# Patient Record
Sex: Female | Born: 1995 | Race: White | Hispanic: No | Marital: Single | Smoking: Never smoker
Health system: Southern US, Community
[De-identification: ages and names within clinical notes are randomized; demographics above are authoritative.]

## PROBLEM LIST (undated history)

## (undated) DIAGNOSIS — K5909 Other constipation: Secondary | ICD-10-CM

## (undated) DIAGNOSIS — F419 Anxiety disorder, unspecified: Secondary | ICD-10-CM

## (undated) HISTORY — PX: WISDOM TOOTH EXTRACTION: SHX21

---
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
## 2015-07-13 ENCOUNTER — Ambulatory Visit
Admission: RE | Admit: 2015-07-13 | Discharge: 2015-07-13 | Disposition: A | Discharge status: Home / Self Care | Payer: 59 | Source: Ambulatory Visit | Attending: Pediatrics | Admitting: Pediatrics

## 2015-07-13 ENCOUNTER — Other Ambulatory Visit: Payer: Self-pay | Admitting: Pediatrics

## 2015-07-13 ENCOUNTER — Other Ambulatory Visit: Payer: Self-pay

## 2015-07-13 DIAGNOSIS — R0602 Shortness of breath: Secondary | ICD-10-CM | POA: Diagnosis present

## 2015-12-20 ENCOUNTER — Encounter: Payer: Self-pay | Admitting: *Deleted

## 2015-12-20 ENCOUNTER — Ambulatory Visit
Admission: EM | Admit: 2015-12-20 | Discharge: 2015-12-20 | Disposition: A | Discharge status: Home / Self Care | Payer: 59 | Attending: Family Medicine | Admitting: Family Medicine

## 2015-12-20 DIAGNOSIS — J029 Acute pharyngitis, unspecified: Secondary | ICD-10-CM | POA: Diagnosis present

## 2015-12-20 DIAGNOSIS — M791 Myalgia: Secondary | ICD-10-CM | POA: Diagnosis not present

## 2015-12-20 DIAGNOSIS — R079 Chest pain, unspecified: Secondary | ICD-10-CM

## 2015-12-20 DIAGNOSIS — R52 Pain, unspecified: Secondary | ICD-10-CM | POA: Diagnosis not present

## 2015-12-20 LAB — RAPID INFLUENZA A&B ANTIGENS
Influenza A (ARMC): NEGATIVE
Influenza B (ARMC): NEGATIVE

## 2015-12-20 LAB — RAPID STREP SCREEN (MED CTR MEBANE ONLY): Streptococcus, Group A Screen (Direct): NEGATIVE

## 2015-12-20 NOTE — Discharge Instructions (Signed)
Go directly to emergency room as discussed.  °

## 2015-12-20 NOTE — ED Provider Notes (Signed)
[facility]  ____________________________________________  Time seen: Approximately 9:21 PM  I have reviewed the triage vital signs and the nursing notes.   HISTORY  Chief Complaint Chills; Generalized Body Aches; Fever; Sore Throat; and Weakness   HPI Kendra Murphy is a 20 y.o. female presents with mother at bedside for the complaints of 3 days of body aches, occasional chills, chest pain, chest pressure and some chest pain with deep breath. States symptoms present x three days and unchanged. Denies cough. Denies sore throat. Denies nausea, vomiting, diarrhea. Reports her father had the flu about a week and a half ago. Denies other known sick contacts. Reports recently changed oral birth control approximately one month ago.  Patient states that chest discomfort is at 6 out of 10 and described as a heaviness and feels like she just can't take a good deep breath as well as when she does take a deep breath that she has some sharper chest pains bilaterally beneath her breasts. Denies fall or trauma. Denies recent sickness, recent immobilization, recent sedentary changes. Denies recent surgery.  Patient reports that she has had some chest pain in the past that was unclear of the cause. Patient states that she had an EKG at her primary doctor's that was normal.  PCP: [facility].  LMP: 3 weeks ago. Denies chance of pregnancy.   History reviewed. No pertinent past medical history.  There are no active problems to display for this patient.   History reviewed. No pertinent past surgical history.  Current Outpatient Rx  Name  Route  Sig  Dispense  Refill  . sertraline (ZOLOFT) 25 MG tablet   Oral   Take 25 mg by mouth daily.           Allergies Review of patient's allergies indicates no known allergies.  History reviewed. No pertinent family history.  Social History Social History  Substance Use Topics  . Smoking status: Never Smoker   . Smokeless tobacco:  None  . Alcohol Use: No    Review of Systems Constitutional: Positive chills. Positive body aches. Eyes: No visual changes. ENT: No sore throat. Cardiovascular: As above Respiratory: As above Gastrointestinal: No abdominal pain.  No nausea, no vomiting.  No diarrhea.  No constipation. Genitourinary: Negative for dysuria. Musculoskeletal: Negative for back pain. Skin: Negative for rash. Neurological: Negative for headaches, focal weakness or numbness.  10-point ROS otherwise negative.  ____________________________________________   PHYSICAL EXAM:  VITAL SIGNS: ED Triage Vitals  Enc Vitals Group     BP 12/20/15 2024 112/62 mmHg     Pulse Rate 12/20/15 2024 106     Resp 12/20/15 2024 20     Temp 12/20/15 2024 99.2 F (37.3 C)     Temp Source 12/20/15 2024 Oral     SpO2 12/20/15 2024 100 %     Weight 12/20/15 2024 95 lb (43.092 kg)     Height 12/20/15 2024 5' (1.524 m)     Head Cir --      Peak Flow --      Pain Score --      Pain Loc --      Pain Edu? --      Excl. in [place]? --     Constitutional: Alert and oriented. Well appearing and in no acute distress. Eyes: Conjunctivae are normal. PERRL. EOMI. Head: Atraumatic. No sinus tenderness palpation. No erythema. No swelling.  Ears: no erythema, normal TMs bilaterally.   Nose: No congestion/rhinnorhea.  Mouth/Throat: Mucous membranes are  moist.  Oropharynx non-erythematous. No tonsillar swelling or exudate. Neck: No stridor.  No cervical spine tenderness to palpation. Hematological/Lymphatic/Immunilogical: No cervical lymphadenopathy. Cardiovascular: Normal rate, regular rhythm. Grossly normal heart sounds.  Good peripheral circulation. Respiratory: Normal respiratory effort.  No retractions. Lungs CTAB. No wheezes, rales or rhonchi. No cough noted in room. Chest nontender to palpation.Speaks in complete sentences.  Gastrointestinal: Mild tenderness to palpation bilateral upper quadrants, no epigastric tenderness, abdomen  otherwise soft and nontender. Normal Bowel sounds.  No abdominal bruits. No CVA tenderness. Musculoskeletal: No lower or upper extremity tenderness nor edema.  No calf tenderness bilaterally. Bilateral pedal pulses equal and easily palpated. Ambulatory in room a steady gait. Neurologic:  Normal speech and language. No gross focal neurologic deficits are appreciated. No gait instability. Skin:  Skin is warm, dry and intact. No rash noted. Psychiatric: Mood and affect are normal. Speech and behavior are normal.  ____________________________________________   LABS (all labs ordered are listed, but only abnormal results are displayed)  Labs Reviewed  RAPID INFLUENZA A&B ANTIGENS ([facility] ONLY)  RAPID STREP SCREEN (NOT AT [facility])  CULTURE, GROUP A STREP [facility])   ____________________________________________  EKG  ED ECG REPORT I, Renford Dills, the attending physician and Dr Judd Gaudier, personally viewed and interpreted this ECG.   Date: 12/20/2015  EKG Time: 2125  Rate: 93  Rhythm: normal EKG, normal sinus rhythm, unchanged from previous tracings  Axis: normal  Intervals:none  ST&T Change: none   INITIAL IMPRESSION / ASSESSMENT AND PLAN / ED COURSE  Pertinent labs & imaging results that were available during my care of the patient were reviewed by me and considered in my medical decision making (see chart for details).  Overall well-appearing patient. No acute distress. Mother at bedside. Presents for the complaints of 3 days of body aches, chest pain and shortness of breath. Patient also reports some chest pain with deep breath. Denies cough congestion or sore throat. Reports has been eating and drinking well. Patient also with mild abdominal pain over last day that she did not initially mention. Patient's symptoms concerning for viral illness with low grade fever however patient's report of chest discomfort with recent oral contraceptive changing and mild tachycardia concern for pulmonary  emboli. Discussed with patient also possibility of anxiety trigger. Patient reports that she does have some history of anxiety but states that she does not feel like she's anxious at this time. Discussed these concerns in detail with patient and mother. And recommend for patient be further evaluated at emergency room of choice. Patient and mother state that they will go to [facility]. Mother and patient states that mother will drive the patient. Patient alert and oriented with decisional capacity and states that she does not want to go by ambulance and states that she understands risk of even up to death by private vehicle transfer. Patient stable at the time of transfer. Rosanne Ashing Forensic scientist at [facility] called by myself and report given.  Discussed follow up with Primary care physician this week. Discussed follow up and return parameters including no resolution or any worsening concerns. Patient verbalized understanding and agreed to plan.   ____________________________________________   FINAL CLINICAL IMPRESSION(S) / ED DIAGNOSES  Final diagnoses:  Body aches  Chest pain, unspecified chest pain type      Note: This dictation was prepared with Dragon dictation along with smaller phrase technology. Any transcriptional errors that result from this process are unintentional.    Renford Dills, NP 12/20/15 2221

## 2015-12-20 NOTE — ED Notes (Signed)
[facility] at patient request. Stevie KernL. Miller NP called report to them. Patient d/c'd ambulatory with Mom to drive

## 2015-12-20 NOTE — ED Notes (Signed)
Chills, body aches, fever, sore throat, weakness starting Saturday. Father recently dx with flu.

## 2015-12-23 LAB — CULTURE, GROUP A STREP (THRC)

## 2017-11-13 ENCOUNTER — Other Ambulatory Visit (HOSPITAL_COMMUNITY): Payer: Self-pay | Admitting: Pediatrics

## 2017-11-13 ENCOUNTER — Other Ambulatory Visit: Payer: Self-pay | Admitting: Pediatrics

## 2017-11-13 DIAGNOSIS — N938 Other specified abnormal uterine and vaginal bleeding: Secondary | ICD-10-CM

## 2017-11-16 ENCOUNTER — Ambulatory Visit
Admission: RE | Admit: 2017-11-16 | Discharge: 2017-11-16 | Disposition: A | Discharge status: Home / Self Care | Payer: BLUE CROSS/BLUE SHIELD | Source: Ambulatory Visit | Attending: Pediatrics | Admitting: Pediatrics

## 2017-11-16 DIAGNOSIS — N938 Other specified abnormal uterine and vaginal bleeding: Secondary | ICD-10-CM | POA: Insufficient documentation

## 2019-08-19 ENCOUNTER — Other Ambulatory Visit: Payer: Self-pay

## 2019-08-19 ENCOUNTER — Ambulatory Visit (INDEPENDENT_AMBULATORY_CARE_PROVIDER_SITE_OTHER): Payer: 59

## 2019-08-19 ENCOUNTER — Encounter: Payer: Self-pay | Admitting: Emergency Medicine

## 2019-08-19 ENCOUNTER — Ambulatory Visit
Admission: EM | Admit: 2019-08-19 | Discharge: 2019-08-19 | Disposition: A | Discharge status: Home / Self Care | Payer: 59 | Attending: Family Medicine | Admitting: Family Medicine

## 2019-08-19 DIAGNOSIS — S92424A Nondisplaced fracture of distal phalanx of right great toe, initial encounter for closed fracture: Secondary | ICD-10-CM | POA: Diagnosis not present

## 2019-08-19 DIAGNOSIS — W208XXA Other cause of strike by thrown, projected or falling object, initial encounter: Secondary | ICD-10-CM | POA: Diagnosis not present

## 2019-08-19 DIAGNOSIS — M79674 Pain in right toe(s): Secondary | ICD-10-CM

## 2019-08-19 MED ORDER — MELOXICAM 15 MG PO TABS: 15.0000 mg | ORAL_TABLET | Freq: Every day | ORAL | 0 refills | Status: DC | PRN

## 2019-08-19 NOTE — Discharge Instructions (Signed)
Rest.  Ice.  Medication as prescribed.  Post op shoe for comfort.  Take care  Dr. Lacinda Axon

## 2019-08-19 NOTE — ED Provider Notes (Signed)
[facility]    CSN: [id number] Arrival date & time: 08/19/19  1059  History   Chief Complaint Chief Complaint  Patient presents with   Toe Pain    right great toe   HPI  23 year old female presents with a toe injury.  Patient was at work.  She states that she was carrying a box and fell.  In doing so she injured her right great toe.  This occurred this morning.  She rates her pain as 3/10 in severity.  She reports swelling.  She localizes the pain to the IP joint of the right great toe.  No medications or interventions tried.  Exacerbated by touch.  No relieving factors.  No other complaints.  PMH, Surgical Hx, Family Hx, Social History reviewed and updated as below.  PMH: Macromastia, DUB  Past Surgical History:  Procedure Laterality Date   WISDOM TOOTH EXTRACTION     OB History   No obstetric history on file.    Home Medications    Prior to Admission medications   Medication Sig Start Date End Date Taking? Authorizing Provider  sertraline (ZOLOFT) 25 MG tablet Take 25 mg by mouth daily.   Yes [provider]  meloxicam (MOBIC) 15 MG tablet Take 1 tablet (15 mg total) by mouth daily as needed for pain. 08/19/19   Coral Spikes, DO   Family History Family History  Problem Relation Age of Onset   Healthy Mother    Healthy Father    Social History Social History   Tobacco Use   Smoking status: Never Smoker   Smokeless tobacco: Never Used  Substance Use Topics   Alcohol use: No   Drug use: Not Currently    Allergies   Patient has no known allergies.   Review of Systems Review of Systems  Constitutional: Negative.   Musculoskeletal:       Right great toe pain.   Physical Exam Triage Vital Signs ED Triage Vitals  Enc Vitals Group     BP 08/19/19 1132 109/79     Pulse Rate 08/19/19 1132 77     Resp 08/19/19 1132 18     Temp 08/19/19 1132 98.3 F (36.8 C)     Temp Source 08/19/19 1132 Oral     SpO2 08/19/19 1132 99 %   Weight 08/19/19 1130 106 lb (48.1 kg)     Height 08/19/19 1130 5' (1.524 m)     Head Circumference --      Peak Flow --      Pain Score 08/19/19 1130 3     Pain Loc --      Pain Edu? --      Excl. in [place]? --    No data found.  Updated Vital Signs BP 109/79 (BP Location: Left Arm)    Pulse 77    Temp 98.3 F (36.8 C) (Oral)    Resp 18    Ht 5' (1.524 m)    Wt 48.1 kg    SpO2 99%    BMI 20.70 kg/m   Visual Acuity Right Eye Distance:   Left Eye Distance:   Bilateral Distance:    Right Eye Near:   Left Eye Near:    Bilateral Near:     Physical Exam Vitals signs and nursing note reviewed.  Constitutional:      General: She is not in acute distress.    Appearance: Normal appearance. She is not ill-appearing.  HENT:     Head: Normocephalic and  atraumatic.  Eyes:     General:        Right eye: No discharge.        Left eye: No discharge.     Conjunctiva/sclera: Conjunctivae normal.  Pulmonary:     Effort: Pulmonary effort is normal. No respiratory distress.  Feet:     Comments: Right great toe with swelling noted distally.  Exquisite tenderness to palpation at the IP joint. Skin:    General: Skin is warm.     Findings: No rash.  Neurological:     General: No focal deficit present.     Mental Status: She is alert and oriented to person, place, and time.  Psychiatric:        Mood and Affect: Mood normal.        Behavior: Behavior normal.    UC Treatments / Results  Labs (all labs ordered are listed, but only abnormal results are displayed) Labs Reviewed - No data to display  EKG   Radiology Dg Toe Great Right  Result Date: 08/19/2019 CLINICAL DATA:  Pain following fall EXAM: RIGHT FIRST TOE: 3 V COMPARISON:  None. FINDINGS: Frontal, oblique, lateral views were obtained. There is a fracture along the dorsal aspect of the proximal portion of the first distal phalanx with fragment extending into the first IP joint. Alignment at the fracture site is near anatomic. No  other fractures are evident. No dislocation. Joint spaces appear normal. No erosive change. IMPRESSION: Fracture along the dorsal aspect of the proximal portion of the first distal phalanx with alignment at the fracture site near anatomic. No other fracture. No dislocation. No appreciable arthropathy. These results will be called to the ordering clinician or representative by the Radiologist Assistant, and communication documented in the PACS or zVision Dashboard. Electronically Signed   By: Bretta Bang III M.D.   On: 08/19/2019 11:46    Procedures Procedures (including critical care time)  Medications Ordered in UC Medications - No data to display  Initial Impression / Assessment and Plan / UC Course  I have reviewed the triage vital signs and the nursing notes.  Pertinent labs & imaging results that were available during my care of the patient were reviewed by me and considered in my medical decision making (see chart for details).    23 year old female presents with a fracture of the dorsal aspect of the proximal portion of the first distal phalanx of the right great toe.  Meloxicam as directed. Placed in postop shoe.  Okay to return to work with restrictions.  Final Clinical Impressions(s) / UC Diagnoses   Final diagnoses:  Closed nondisplaced fracture of distal phalanx of right great toe, initial encounter     Discharge Instructions     Rest.  Ice.  Medication as prescribed.  Post op shoe for comfort.  Take care  Dr. Adriana Simas    ED Prescriptions    Medication Sig Dispense Auth. Provider   meloxicam (MOBIC) 15 MG tablet Take 1 tablet (15 mg total) by mouth daily as needed for pain. 30 tablet Tommie Sams, DO     PDMP not reviewed this encounter.   Tommie Sams, DO 08/19/19 1308

## 2019-08-19 NOTE — ED Triage Notes (Signed)
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 This occurred this morning.

## 2019-09-15 ENCOUNTER — Other Ambulatory Visit: Payer: Self-pay | Admitting: Family Medicine

## 2020-07-13 IMAGING — CR DG TOE GREAT 2+V*R*
3 series · 4 of 4 positions shown · non-contrast
Comparison: None.

CLINICAL DATA: Pain following fall

EXAM:
RIGHT FIRST TOE: 3 V

[toe ap]
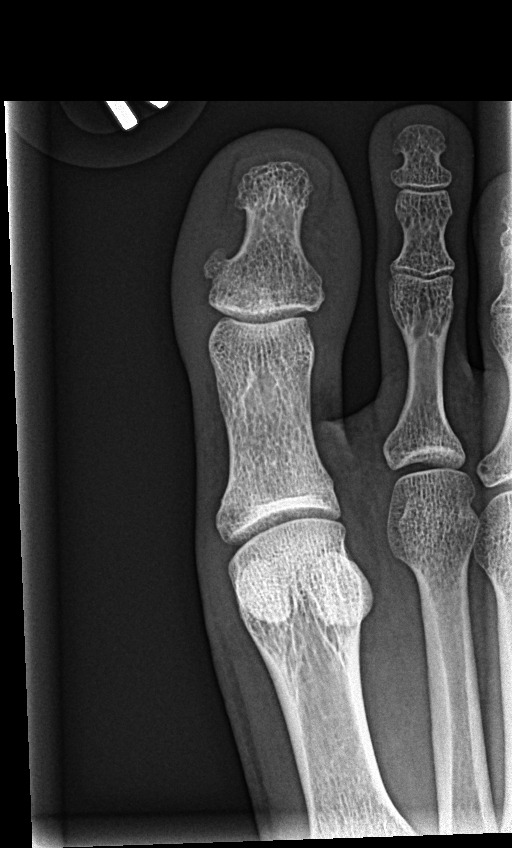

[Series 2: toe obl · 0.14mm/px · 2 of 2 slices shown]
[im 1/2]
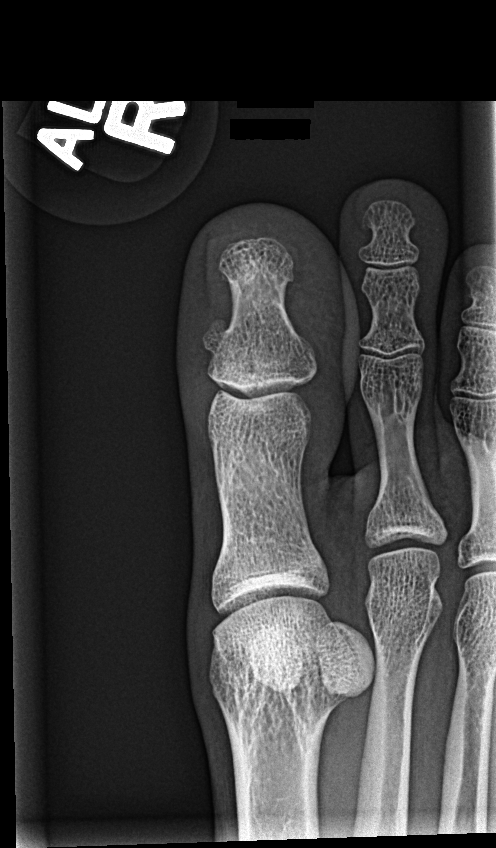
[im 2/2]
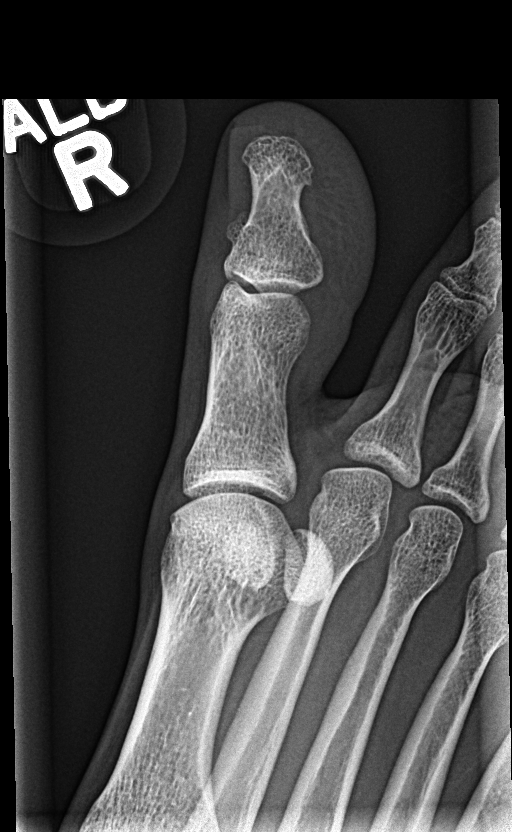

[toe lat]
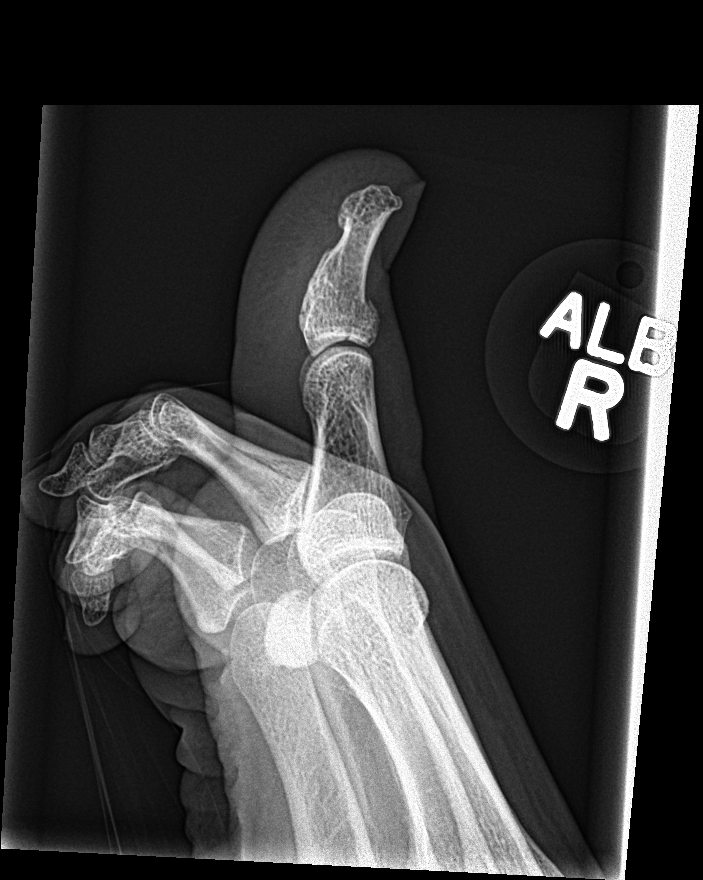

[4 of 4 positions shown; findings below may reference images not displayed]

FINDINGS: Frontal, oblique, lateral views were obtained. There is a fracture
along the dorsal aspect of the proximal portion of the first distal
phalanx with fragment extending into the first IP joint. Alignment
at the fracture site is near anatomic. No other fractures are
evident. No dislocation. Joint spaces appear normal. No erosive
change.
IMPRESSION: Fracture along the dorsal aspect of the proximal portion of the
first distal phalanx with alignment at the fracture site near
anatomic. No other fracture. No dislocation. No appreciable
arthropathy.

These results will be called to the ordering clinician or
representative by the Radiologist Assistant, and communication
documented in the PACS or zVision Dashboard.

## 2020-08-27 ENCOUNTER — Ambulatory Visit: Payer: Self-pay

## 2020-08-27 ENCOUNTER — Other Ambulatory Visit: Payer: Self-pay

## 2020-08-27 ENCOUNTER — Ambulatory Visit
Admission: EM | Admit: 2020-08-27 | Discharge: 2020-08-27 | Disposition: A | Discharge status: Home / Self Care | Payer: BC Managed Care – PPO | Attending: Emergency Medicine | Admitting: Emergency Medicine

## 2020-08-27 ENCOUNTER — Encounter: Payer: Self-pay | Admitting: Emergency Medicine

## 2020-08-27 DIAGNOSIS — N898 Other specified noninflammatory disorders of vagina: Secondary | ICD-10-CM | POA: Insufficient documentation

## 2020-08-27 LAB — URINALYSIS, COMPLETE (UACMP) WITH MICROSCOPIC
Bilirubin Urine: NEGATIVE
Glucose, UA: NEGATIVE mg/dL
Ketones, ur: NEGATIVE mg/dL
Nitrite: NEGATIVE
Protein, ur: NEGATIVE mg/dL
Specific Gravity, Urine: 1.01 (ref 1.005–1.030)
pH: 5.5 (ref 5.0–8.0)

## 2020-08-27 LAB — WET PREP, GENITAL
Clue Cells Wet Prep HPF POC: NONE SEEN
Sperm: NONE SEEN
Trich, Wet Prep: NONE SEEN
Yeast Wet Prep HPF POC: NONE SEEN

## 2020-08-27 LAB — CHLAMYDIA/NGC RT PCR (ARMC ONLY)
Chlamydia Tr: NOT DETECTED
N gonorrhoeae: NOT DETECTED

## 2020-08-27 NOTE — ED Provider Notes (Signed)
[facility]    CSN: [id number] Arrival date & time: 08/27/20  6270      History   Chief Complaint Chief Complaint  Patient presents with  . Vaginal Discharge    HPI Kendra Murphy is a 24 y.o. female.   Unk Lightning presents with complaints of vaginal itching which started around 5 days ago. She had vaginal discharge which was concerning for yeast infection at that time so she did a virtual visit and was given medications for this- took 150mg  diflucan on 11/9 and 11/11. She feels the itching hasn't improved. No further noticeable discharge. Itching is within the vagina, denies any external vulvar itching or complaints. She did wax pubic hair yesterday with some mild irritation from this but no itching. Denies any sores or lesions. Denies any known new products- soaps, detergents, lubricants, condoms etc. Has an IUD and had spotting/period around 3 weeks ago. Sexually active with 1 partner and doesn't regularly use condoms. Denies any concerns for std's. States she has some mild burning with urination but unable to differentiate if this is external/vulvar from urine or if it is urethral. No urinary urgency or freuqency.   ROS per HPI, negative if not otherwise mentioned.      History reviewed. No pertinent past medical history.  There are no problems to display for this patient.   Past Surgical History:  Procedure Laterality Date  . WISDOM TOOTH EXTRACTION      OB History   No obstetric history on file.      Home Medications    Prior to Admission medications   Medication Sig Start Date End Date Taking? Authorizing Provider  levonorgestrel (LILETTA) 19.5 MCG/DAY IUD IUD by Intrauterine route. 08/11/19 08/10/25 Yes [provider]  meloxicam (MOBIC) 15 MG tablet Take 1 tablet (15 mg total) by mouth daily as needed for pain. 08/19/19   13/3/20, DO  sertraline (ZOLOFT) 25 MG tablet Take 25 mg by mouth daily.    [provider]     Family History Family History  Problem Relation Age of Onset  . Healthy Mother   . Healthy Father     Social History Social History   Tobacco Use  . Smoking status: Never Smoker  . Smokeless tobacco: Never Used  Vaping Use  . Vaping Use: Never used  Substance Use Topics  . Alcohol use: No  . Drug use: Not Currently     Allergies   Patient has no known allergies.   Review of Systems Review of Systems   Physical Exam Triage Vital Signs ED Triage Vitals  Enc Vitals Group     BP 08/27/20 0934 104/75     Pulse Rate 08/27/20 0934 (!) 103     Resp 08/27/20 0934 14     Temp 08/27/20 0934 98.2 F (36.8 C)     Temp Source 08/27/20 0934 Oral     SpO2 08/27/20 0934 99 %     Weight 08/27/20 0931 110 lb (49.9 kg)     Height 08/27/20 0931 5\' 1"  (1.549 m)     Head Circumference --      Peak Flow --      Pain Score 08/27/20 0931 5     Pain Loc --      Pain Edu? --      Excl. in [place]? --    No data found.  Updated Vital Signs BP 104/75 (BP Location: Left Arm)   Pulse (!) 103  Temp 98.2 F (36.8 C) (Oral)   Resp 14   Ht 5\' 1"  (1.549 m)   Wt 110 lb (49.9 kg)   SpO2 99%   BMI 20.78 kg/m   Visual Acuity Right Eye Distance:   Left Eye Distance:   Bilateral Distance:    Right Eye Near:   Left Eye Near:    Bilateral Near:     Physical Exam Constitutional:      General: She is not in acute distress.    Appearance: She is well-developed.  Cardiovascular:     Rate and Rhythm: Normal rate.  Pulmonary:     Effort: Pulmonary effort is normal.  Genitourinary:    Labia:        Right: No rash, tenderness or lesion.        Left: No rash, tenderness or lesion.      Vagina: Normal. No bleeding or lesions.     Cervix: No cervical bleeding.     Comments: Mild redness to external labia majora which patient endorses is consistent with getting wax done yesterday; no sores or lesions; no fissures; thin white vaginal discharge without obvious odor; visualized iud  strings  Skin:    General: Skin is warm and dry.  Neurological:     Mental Status: She is alert and oriented to person, place, and time.      UC Treatments / Results  Labs (all labs ordered are listed, but only abnormal results are displayed) Labs Reviewed  WET PREP, GENITAL - Abnormal; Notable for the following components:      Result Value   WBC, Wet Prep HPF POC FEW (*)    All other components within normal limits  URINALYSIS, COMPLETE (UACMP) WITH MICROSCOPIC - Abnormal; Notable for the following components:   APPearance HAZY (*)    Hgb urine dipstick TRACE (*)    Leukocytes,Ua MODERATE (*)    Bacteria, UA FEW (*)    All other components within normal limits  CHLAMYDIA/NGC RT PCR ([facility] ONLY)  URINE CULTURE    EKG   Radiology No results found.  Procedures Procedures (including critical care time)  Medications Ordered in UC Medications - No data to display  Initial Impression / Assessment and Plan / UC Course  I have reviewed the triage vital signs and the nursing notes.  Pertinent labs & imaging results that were available during my care of the patient were reviewed by me and considered in my medical decision making (see chart for details).     Normal wet prep. Vaginal itching. Exam without obvious source of itching. External irritation likely secondary to wax yesterday. Urine is not obvious for uti with culture pending. Gc/chlamydia screen pending. Cool compresses/ sitz baths recommended, pelvic rest recommended. Follow up with gynecology as needed. Patient verbalized understanding and agreeable to plan.   Final Clinical Impressions(s) / UC Diagnoses   Final diagnoses:  Vaginal itching     Discharge Instructions     I do not see an obvious source of your symptoms here today.  There is no further yeast or bacterial vaginosis on your initial vaginal test.  You do not have an obvious urinary tract infection, I have sent it to be cultured to  confirm this and we would call you if antibiotics are warranted. We would call you if any positive findings from your std screen.  Sitz baths, or cool compresses may help ease some of your itching.  I would avoid intercourse or any products to the vagina  until symptoms have resolved.  Return or see your pcp/ gynecologist if it is persistent or any worsening.    ED Prescriptions    None     PDMP not reviewed this encounter.   Georgetta Haber, NP 08/27/20 1102

## 2020-08-27 NOTE — Discharge Instructions (Signed)
I do not see an obvious source of your symptoms here today.  There is no further yeast or bacterial vaginosis on your initial vaginal test.  You do not have an obvious urinary tract infection, I have sent it to be cultured to confirm this and we would call you if antibiotics are warranted. We would call you if any positive findings from your std screen.  Sitz baths, or cool compresses may help ease some of your itching.  I would avoid intercourse or any products to the vagina until symptoms have resolved.  Return or see your pcp/ gynecologist if it is persistent or any worsening.

## 2020-08-27 NOTE — ED Triage Notes (Signed)
Patient c/o vaginal itching and discharge that started on Sunday.  Patient states that she started a yeast infection medicine on Tuesday and states that her symptoms have worsen.

## 2020-08-28 ENCOUNTER — Ambulatory Visit: Payer: Self-pay

## 2020-08-28 LAB — URINE CULTURE: Culture: 40000 — AB

## 2021-08-16 ENCOUNTER — Other Ambulatory Visit: Payer: Self-pay

## 2021-08-16 ENCOUNTER — Ambulatory Visit
Admission: EM | Admit: 2021-08-16 | Discharge: 2021-08-16 | Disposition: A | Discharge status: Home / Self Care | Payer: BC Managed Care – PPO

## 2021-08-16 DIAGNOSIS — A084 Viral intestinal infection, unspecified: Secondary | ICD-10-CM

## 2021-08-16 HISTORY — DX: Anxiety disorder, unspecified: F41.9

## 2021-08-16 HISTORY — DX: Other constipation: K59.09

## 2021-08-16 MED ORDER — LOPERAMIDE HCL 2 MG PO CAPS: 2.0000 mg | ORAL_CAPSULE | Freq: Four times a day (QID) | ORAL | 0 refills | Status: AC | PRN

## 2021-08-16 MED ORDER — ONDANSETRON HCL 4 MG PO TABS: 4.0000 mg | ORAL_TABLET | Freq: Four times a day (QID) | ORAL | 0 refills | Status: AC

## 2021-08-16 NOTE — ED Triage Notes (Signed)
Pt with 3 days of nausea and diarrhea. "Nothing sitting well on my stomach." Denies vomiting.

## 2021-08-16 NOTE — Discharge Instructions (Signed)
Your symptoms are most likely caused by a virus and ideally should improve with time I have low suspicion of an infectious cause at this time, your vital signs are stable and your abdominal pain is very mild  You may use Zofran every 6 hours to help with the nausea, wait 30 minutes to an hour before attempting to eat, try bland and mild foods as not to irritate the stomach, rehydrate is much as possible using water and electrolyte replacement substances such as Gatorade or similar product   you may use Imodium as needed to help slow and reduce diarrhea, take 1 pill and wait at least 6 to 8 hours to see how your body reacts before taking another dose, may use medic Acacian up to 4 times a day, be mindful over use of Imodium will call us constipation  May return to urgent care as needed if symptoms are persistent or not improving

## 2021-08-16 NOTE — ED Provider Notes (Addendum)
[facility]    CSN: [id number] Arrival date & time: 08/16/21  1128      History   Chief Complaint Chief Complaint  Patient presents with   Diarrhea   Nausea    HPI Kendra Murphy is a 25 y.o. female.   Patient presents with nausea without vomiting and diarrhea for 3 days beginning after consumption of seafood.  Denies prior reaction.  last episode of diarrhea this morning.  Described as soft and watery.  Poor appetite but tolerating fluids.  Denies fever, chills, body aches, blood in stool, mucus in stool, abdominal pain, urinary changes, recent travel.  History of constipation.  Past Medical History:  Diagnosis Date   Anxiety    Chronic constipation     There are no problems to display for this patient.   Past Surgical History:  Procedure Laterality Date   WISDOM TOOTH EXTRACTION      OB History   No obstetric history on file.      Home Medications    Prior to Admission medications   Medication Sig Start Date End Date Taking? Authorizing Provider  loperamide (IMODIUM) 2 MG capsule Take 1 capsule (2 mg total) by mouth 4 (four) times daily as needed for diarrhea or loose stools. 08/16/21  Yes Clio Gerhart R, NP  ondansetron (ZOFRAN) 4 MG tablet Take 1 tablet (4 mg total) by mouth every 6 (six) hours. 08/16/21  Yes Kienna Moncada, Elita Boone, NP  Docusate Sodium (DSS) 100 MG CAPS Take by mouth.    [provider]  levonorgestrel (LILETTA) 19.5 MCG/DAY IUD IUD by Intrauterine route. 08/11/19 08/10/25  [provider]  sertraline (ZOLOFT) 25 MG tablet Take 25 mg by mouth daily.    [provider]    Family History Family History  Problem Relation Age of Onset   Healthy Mother    Healthy Father     Social History Social History   Tobacco Use   Smoking status: Never   Smokeless tobacco: Never  Vaping Use   Vaping Use: Never used  Substance Use Topics   Alcohol use: Yes    Comment: social   Drug use: Not Currently      Allergies   Tioconazole   Review of Systems Review of Systems  Constitutional: Negative.   Respiratory: Negative.    Cardiovascular: Negative.   Gastrointestinal:  Positive for abdominal pain, diarrhea and nausea. Negative for abdominal distention, anal bleeding, blood in stool, constipation, rectal pain and vomiting.  Skin: Negative.   Neurological: Negative.     Physical Exam Triage Vital Signs ED Triage Vitals  Enc Vitals Group     BP 08/16/21 1326 109/76     Pulse Rate 08/16/21 1326 77     Resp 08/16/21 1326 16     Temp 08/16/21 1326 97.6 F (36.4 C)     Temp Source 08/16/21 1326 Oral     SpO2 08/16/21 1326 100 %     Weight 08/16/21 1322 107 lb (48.5 kg)     Height 08/16/21 1322 5\' 1"  (1.549 m)     Head Circumference --      Peak Flow --      Pain Score 08/16/21 1322 1     Pain Loc --      Pain Edu? --      Excl. in GC? --    No data found.  Updated Vital Signs BP 109/76 (BP Location: Left Arm)   Pulse 77   Temp 97.6 F (  36.4 C) (Oral)   Resp 16   Ht 5\' 1"  (1.549 m)   Wt 107 lb (48.5 kg)   SpO2 100%   BMI 20.22 kg/m   Visual Acuity Right Eye Distance:   Left Eye Distance:   Bilateral Distance:    Right Eye Near:   Left Eye Near:    Bilateral Near:     Physical Exam Constitutional:      Appearance: Normal appearance. She is normal weight.  HENT:     Head: Normocephalic.  Eyes:     Extraocular Movements: Extraocular movements intact.  Pulmonary:     Effort: Pulmonary effort is normal.  Abdominal:     General: Abdomen is flat. Bowel sounds are normal. There is no distension.     Palpations: Abdomen is soft.     Tenderness: There is no abdominal tenderness.  Skin:    General: Skin is warm and dry.  Neurological:     Mental Status: She is alert and oriented to person, place, and time. Mental status is at baseline.  Psychiatric:        Mood and Affect: Mood normal.        Behavior: Behavior normal.     UC Treatments / Results   Labs (all labs ordered are listed, but only abnormal results are displayed) Labs Reviewed - No data to display  EKG   Radiology No results found.  Procedures Procedures (including critical care time)  Medications Ordered in UC Medications - No data to display  Initial Impression / Assessment and Plan / UC Course  I have reviewed the triage vital signs and the nursing notes.  Pertinent labs & imaging results that were available during my care of the patient were reviewed by me and considered in my medical decision making (see chart for details).  Viral gastroenteritis  Vital signs stable, patient in no signs of distress, low suspicion for more serious organ involvement based on exam, will manage conservatively today with strict return precautions  1.  Imodium 2 mg 4 times daily as needed 2.  Zofran 4 mg every 6 hours as needed, advised increasing fluid intake to prevent dehydration 3.  Follow-up in urgent care as needed for persistent symptoms advised for worsening symptoms please go to the nearest emergency department Final Clinical Impressions(s) / UC Diagnoses   Final diagnoses:  Viral gastroenteritis     Discharge Instructions      Your symptoms are most likely caused by a virus and ideally should improve with time I have low suspicion of an infectious cause at this time, your vital signs are stable and your abdominal pain is very mild  You may use Zofran every 6 hours to help with the nausea, wait 30 minutes to an hour before attempting to eat, try bland and mild foods as not to irritate the stomach, rehydrate is much as possible using water and electrolyte replacement substances such as Gatorade or similar product   you may use Imodium as needed to help slow and reduce diarrhea, take 1 pill and wait at least 6 to 8 hours to see how your body reacts before taking another dose, may use medic Acacian up to 4 times a day, be mindful over use of Imodium will call  constipation  May return to urgent care as needed if symptoms are persistent or not improving     ED Prescriptions     Medication Sig Dispense Auth. Provider   loperamide (IMODIUM) 2 MG capsule Take  1 capsule (2 mg total) by mouth 4 (four) times daily as needed for diarrhea or loose stools. 12 capsule Kerrington Greenhalgh R, NP   ondansetron (ZOFRAN) 4 MG tablet Take 1 tablet (4 mg total) by mouth every 6 (six) hours. 12 tablet Keierra Nudo, Elita Boone, NP      PDMP not reviewed this encounter.   Valinda Hoar, NP 08/16/21 1347    Valinda Hoar, NP 08/16/21 1348

## 2023-03-21 ENCOUNTER — Ambulatory Visit
Admission: EM | Admit: 2023-03-21 | Discharge: 2023-03-21 | Disposition: A | Discharge status: Home / Self Care | Payer: BC Managed Care – PPO | Attending: Emergency Medicine | Admitting: Emergency Medicine

## 2023-03-21 ENCOUNTER — Emergency Department: Payer: BC Managed Care – PPO

## 2023-03-21 ENCOUNTER — Encounter: Payer: Self-pay | Admitting: Emergency Medicine

## 2023-03-21 DIAGNOSIS — R059 Cough, unspecified: Secondary | ICD-10-CM | POA: Diagnosis not present

## 2023-03-21 DIAGNOSIS — R509 Fever, unspecified: Secondary | ICD-10-CM | POA: Diagnosis present

## 2023-03-21 DIAGNOSIS — Z20822 Contact with and (suspected) exposure to covid-19: Secondary | ICD-10-CM | POA: Insufficient documentation

## 2023-03-21 DIAGNOSIS — R110 Nausea: Secondary | ICD-10-CM | POA: Diagnosis not present

## 2023-03-21 DIAGNOSIS — J029 Acute pharyngitis, unspecified: Secondary | ICD-10-CM | POA: Insufficient documentation

## 2023-03-21 DIAGNOSIS — J069 Acute upper respiratory infection, unspecified: Secondary | ICD-10-CM

## 2023-03-21 LAB — GROUP A STREP BY PCR: Group A Strep by PCR: NOT DETECTED

## 2023-03-21 LAB — POCT RAPID STREP A (OFFICE): Rapid Strep A Screen: NEGATIVE

## 2023-03-21 LAB — SARS CORONAVIRUS 2 BY RT PCR: SARS Coronavirus 2 by RT PCR: NEGATIVE

## 2023-03-21 MED ORDER — ACETAMINOPHEN 500 MG PO TABS
1000.0000 mg | ORAL_TABLET | Freq: Once | ORAL | Status: AC
  Administered 2023-03-21: 1000 mg via ORAL
  Filled 2023-03-21: qty 2

## 2023-03-21 NOTE — Discharge Instructions (Addendum)
The strep test is negative.      Take Tylenol as needed for fever or discomfort.  Rest and keep yourself hydrated.    Follow-up with your primary care provider if your symptoms are not improving.     

## 2023-03-21 NOTE — ED Triage Notes (Signed)
102.1 temp, Sore throat, congestion, body aches, fatigue that started 2 days ago. Taking tylenol.

## 2023-03-21 NOTE — ED Triage Notes (Signed)
Pt c/o fever, chills, sore throat, cough x2 days. Pt seen at [facility] today and told she had a virus and to treat symptomatically. Pt request to be tested for Mono due to having it 6 years ago with similar symptoms. Pt advised to speak with the provider once seen.

## 2023-03-21 NOTE — ED Provider Notes (Signed)
[name]    CSN: [id number] Arrival date & time: 03/21/23  4098      History   Chief Complaint Chief Complaint  Patient presents with   Sore Throat    HPI Kendra Murphy is a 27 y.o. female.  Presents with 2-day history of fever, body aches, fatigue, congestion, postnasal drip, sore throat.  No rash, cough, shortness of breath, vomiting, or other symptoms.  Treating with Tylenol; last dose at 0900.  The history is provided by the patient and medical records.    Past Medical History:  Diagnosis Date   Anxiety    Chronic constipation     There are no problems to display for this patient.   Past Surgical History:  Procedure Laterality Date   WISDOM TOOTH EXTRACTION      OB History   No obstetric history on file.      Home Medications    Prior to Admission medications   Medication Sig Start Date End Date Taking? Authorizing Provider  Docusate Sodium (DSS) 100 MG CAPS Take by mouth.   Yes [provider]  levonorgestrel (LILETTA) 19.5 MCG/DAY IUD IUD by Intrauterine route. 08/11/19 08/10/25  [provider]  loperamide (IMODIUM) 2 MG capsule Take 1 capsule (2 mg total) by mouth 4 (four) times daily as needed for diarrhea or loose stools. 08/16/21   White, Elita Boone, NP  ondansetron (ZOFRAN) 4 MG tablet Take 1 tablet (4 mg total) by mouth every 6 (six) hours. 08/16/21   Valinda Hoar, NP  sertraline (ZOLOFT) 25 MG tablet Take 25 mg by mouth daily.    [provider]    Family History Family History  Problem Relation Age of Onset   Healthy Mother    Healthy Father     Social History Social History   Tobacco Use   Smoking status: Never   Smokeless tobacco: Never  Vaping Use   Vaping Use: Never used  Substance Use Topics   Alcohol use: Yes    Comment: social   Drug use: Not Currently     Allergies   Tioconazole   Review of Systems Review of Systems  Constitutional:  Positive for fatigue and fever.  Negative for chills.  HENT:  Positive for postnasal drip and sore throat. Negative for ear pain.   Respiratory:  Negative for cough and shortness of breath.   Cardiovascular:  Negative for chest pain and palpitations.  Gastrointestinal:  Negative for diarrhea and vomiting.  Skin:  Negative for rash.     Physical Exam Triage Vital Signs ED Triage Vitals [03/21/23 1143]  Enc Vitals Group     BP      Pulse      Resp      Temp      Temp src      SpO2      Weight      Height      Head Circumference      Peak Flow      Pain Score 2     Pain Loc      Pain Edu?      Excl. in GC?    No data found.  Updated Vital Signs BP 107/73   Pulse (!) 107   Temp 98.8 F (37.1 C)   Resp 18   SpO2 99%   Visual Acuity Right Eye Distance:   Left Eye Distance:   Bilateral Distance:    Right Eye Near:   Left Eye Near:  Bilateral Near:     Physical Exam Vitals and nursing note reviewed.  Constitutional:      General: She is not in acute distress.    Appearance: Normal appearance. She is well-developed. She is not ill-appearing.  HENT:     Right Ear: Tympanic membrane normal.     Left Ear: Tympanic membrane normal.     Nose: Nose normal.     Mouth/Throat:     Mouth: Mucous membranes are moist.     Pharynx: Oropharynx is clear.     Comments: Clear PND. Cardiovascular:     Rate and Rhythm: Normal rate and regular rhythm.     Heart sounds: Normal heart sounds.  Pulmonary:     Effort: Pulmonary effort is normal. No respiratory distress.     Breath sounds: Normal breath sounds.  Musculoskeletal:     Cervical back: Neck supple.  Skin:    General: Skin is warm and dry.  Neurological:     Mental Status: She is alert.  Psychiatric:        Mood and Affect: Mood normal.        Behavior: Behavior normal.      UC Treatments / Results  Labs (all labs ordered are listed, but only abnormal results are displayed) Labs Reviewed  POCT RAPID STREP A (OFFICE)     EKG   Radiology No results found.  Procedures Procedures (including critical care time)  Medications Ordered in UC Medications - No data to display  Initial Impression / Assessment and Plan / UC Course  I have reviewed the triage vital signs and the nursing notes.  Pertinent labs & imaging results that were available during my care of the patient were reviewed by me and considered in my medical decision making (see chart for details).    Viral URI.  Rapid strep negative.  Discussed symptomatic treatment including Tylenol, rest, hydration.  Instructed patient to follow up with her PCP if symptoms are not improving.  She agrees to plan of care.   Final Clinical Impressions(s) / UC Diagnoses   Final diagnoses:  Viral URI     Discharge Instructions      The strep test is negative.    Take Tylenol as needed for fever or discomfort.  Rest and keep yourself hydrated.    Follow-up with your primary care provider if your symptoms are not improving.         ED Prescriptions   None    PDMP not reviewed this encounter.   Mickie Bail, NP 03/21/23 [phone]

## 2023-03-22 ENCOUNTER — Emergency Department
Admission: EM | Admit: 2023-03-22 | Discharge: 2023-03-22 | Disposition: A | Discharge status: Home / Self Care | Payer: BC Managed Care – PPO | Attending: Emergency Medicine | Admitting: Emergency Medicine

## 2023-03-22 DIAGNOSIS — R509 Fever, unspecified: Secondary | ICD-10-CM

## 2023-03-22 DIAGNOSIS — N390 Urinary tract infection, site not specified: Secondary | ICD-10-CM

## 2023-03-22 DIAGNOSIS — J029 Acute pharyngitis, unspecified: Secondary | ICD-10-CM

## 2023-03-22 LAB — CBC WITH DIFFERENTIAL/PLATELET
Abs Immature Granulocytes: 0.05 10*3/uL (ref 0.00–0.07)
Basophils Absolute: 0 10*3/uL (ref 0.0–0.1)
Basophils Relative: 0 %
Eosinophils Absolute: 0 10*3/uL (ref 0.0–0.5)
Eosinophils Relative: 0 %
HCT: 42.1 % (ref 36.0–46.0)
Hemoglobin: 13.8 g/dL (ref 12.0–15.0)
Immature Granulocytes: 0 %
Lymphocytes Relative: 12 %
Lymphs Abs: 1.9 10*3/uL (ref 0.7–4.0)
MCH: 29.1 pg (ref 26.0–34.0)
MCHC: 32.8 g/dL (ref 30.0–36.0)
MCV: 88.6 fL (ref 80.0–100.0)
Monocytes Absolute: 1 10*3/uL (ref 0.1–1.0)
Monocytes Relative: 6 %
Neutro Abs: 12.9 10*3/uL — ABNORMAL HIGH (ref 1.7–7.7)
Neutrophils Relative %: 82 %
Platelets: 252 10*3/uL (ref 150–400)
RBC: 4.75 MIL/uL (ref 3.87–5.11)
RDW: 12.5 % (ref 11.5–15.5)
WBC: 15.8 10*3/uL — ABNORMAL HIGH (ref 4.0–10.5)
nRBC: 0 % (ref 0.0–0.2)

## 2023-03-22 LAB — COMPREHENSIVE METABOLIC PANEL
ALT: 12 U/L (ref 0–44)
AST: 22 U/L (ref 15–41)
Albumin: 3.7 g/dL (ref 3.5–5.0)
Alkaline Phosphatase: 75 U/L (ref 38–126)
Anion gap: 10 (ref 5–15)
BUN: <5 mg/dL — ABNORMAL LOW (ref 6–20)
CO2: 22 mmol/L (ref 22–32)
Calcium: 8.9 mg/dL (ref 8.9–10.3)
Chloride: 105 mmol/L (ref 98–111)
Creatinine, Ser: 0.59 mg/dL (ref 0.44–1.00)
GFR, Estimated: >60 mL/min (ref >60)
Glucose, Bld: 136 mg/dL — ABNORMAL HIGH (ref 70–99)
Potassium: 3.7 mmol/L (ref 3.5–5.1)
Sodium: 137 mmol/L (ref 135–145)
Total Bilirubin: 0.5 mg/dL (ref 0.3–1.2)
Total Protein: 7.1 g/dL (ref 6.5–8.1)

## 2023-03-22 LAB — PREGNANCY, URINE: Preg Test, Ur: NEGATIVE

## 2023-03-22 LAB — URINALYSIS, ROUTINE W REFLEX MICROSCOPIC
Bacteria, UA: NONE SEEN
Bilirubin Urine: NEGATIVE
Glucose, UA: NEGATIVE mg/dL
Ketones, ur: NEGATIVE mg/dL
Nitrite: NEGATIVE
Protein, ur: NEGATIVE mg/dL
Specific Gravity, Urine: 1.006 (ref 1.005–1.030)
pH: 6 (ref 5.0–8.0)

## 2023-03-22 LAB — LACTIC ACID, PLASMA: Lactic Acid, Venous: 1.4 mmol/L (ref 0.5–1.9)

## 2023-03-22 LAB — MONONUCLEOSIS SCREEN: Mono Screen: NEGATIVE

## 2023-03-22 MED ORDER — SODIUM CHLORIDE 0.9 % IV BOLUS
1000.0000 mL | Freq: Once | INTRAVENOUS | Status: AC
  Administered 2023-03-22: 1000 mL via INTRAVENOUS

## 2023-03-22 MED ORDER — MAGIC MOUTHWASH
10.0000 mL | Freq: Once | ORAL | Status: AC
  Administered 2023-03-22: 10 mL via ORAL
  Filled 2023-03-22: qty 10

## 2023-03-22 MED ORDER — ONDANSETRON HCL 4 MG/2ML IJ SOLN
4.0000 mg | Freq: Once | INTRAMUSCULAR | Status: AC
  Administered 2023-03-22: 4 mg via INTRAVENOUS
  Filled 2023-03-22: qty 2

## 2023-03-22 MED ORDER — AMOXICILLIN 500 MG PO CAPS: 500.0000 mg | ORAL_CAPSULE | Freq: Three times a day (TID) | ORAL | 0 refills | Status: DC

## 2023-03-22 MED ORDER — KETOROLAC TROMETHAMINE 30 MG/ML IJ SOLN
15.0000 mg | Freq: Once | INTRAMUSCULAR | Status: AC
  Administered 2023-03-22: 15 mg via INTRAVENOUS
  Filled 2023-03-22: qty 1

## 2023-03-22 MED ORDER — DEXAMETHASONE SODIUM PHOSPHATE 10 MG/ML IJ SOLN
10.0000 mg | Freq: Once | INTRAMUSCULAR | Status: AC
  Administered 2023-03-22: 10 mg via INTRAVENOUS
  Filled 2023-03-22: qty 1

## 2023-03-22 MED ORDER — MAGIC MOUTHWASH: ORAL | 0 refills | Status: AC

## 2023-03-22 NOTE — ED Notes (Signed)
Dr. Dolores Frame notified patient's BP softer now. Order received for addition liter of LR.

## 2023-03-22 NOTE — Discharge Instructions (Signed)
1.  Take and finish antibiotic as prescribed (Amoxicillin 500mg  3 times daily x 7 days). 2.  You may use Magic mouthwash as needed for throat discomfort. 3.  Drink plenty of fluids daily. 4.  Return to the ER for worsening symptoms, persistent vomiting, difficulty breathing or other concerns.

## 2023-03-22 NOTE — ED Provider Notes (Signed)
[facility] Provider Note    Event Date/Time   First MD Initiated Contact with Patient 03/22/23 0340     (approximate)   History   Fever, Cough, and Sore Throat   HPI  Kendra Murphy is a 27 y.o. female who presents to the ED from home with a chief complaint of cold-like symptoms.  Symptoms x 4 days including fever/chills, sore throat, nonproductive cough.  Seen at urgent care yesterday and diagnosed with virus.  Patient requesting monotest because she states she is having similar symptoms as when she had mono 6 years ago.  Endorses nausea without vomiting.  Denies chest pain, shortness of breath, abdominal pain, vomiting, or diarrhea.     Past Medical History   Past Medical History:  Diagnosis Date   Anxiety    Chronic constipation      Active Problem List  There are no problems to display for this patient.    Past Surgical History   Past Surgical History:  Procedure Laterality Date   WISDOM TOOTH EXTRACTION       Home Medications   Prior to Admission medications   Medication Sig Start Date End Date Taking? Authorizing Provider  Docusate Sodium (DSS) 100 MG CAPS Take by mouth.    [provider]  levonorgestrel (LILETTA) 19.5 MCG/DAY IUD IUD by Intrauterine route. 08/11/19 08/10/25  [provider]  loperamide (IMODIUM) 2 MG capsule Take 1 capsule (2 mg total) by mouth 4 (four) times daily as needed for diarrhea or loose stools. 08/16/21   White, Elita Boone, NP  ondansetron (ZOFRAN) 4 MG tablet Take 1 tablet (4 mg total) by mouth every 6 (six) hours. 08/16/21   Valinda Hoar, NP  sertraline (ZOLOFT) 25 MG tablet Take 25 mg by mouth daily.    [provider]     Allergies  Tioconazole   Family History   Family History  Problem Relation Age of Onset   Healthy Mother    Healthy Father      Physical Exam  Triage Vital Signs: ED Triage Vitals [03/21/23 2122]  Enc Vitals Group     BP 117/68      Pulse Rate (!) 121     Resp 19     Temp (!) 102.3 F (39.1 C)     Temp Source Oral     SpO2 98 %     Weight 125 lb (56.7 kg)     Height 5\' 1"  (1.549 m)     Head Circumference      Peak Flow      Pain Score 0     Pain Loc      Pain Edu?      Excl. in GC?     Updated Vital Signs: BP 105/73   Pulse 90   Temp 98.6 F (37 C)   Resp 16   Ht 5\' 1"  (1.549 m)   Wt 56.7 kg   SpO2 100%   BMI 23.62 kg/m    General: Awake, mild distress.  CV:  RRR.  Good peripheral perfusion.  Resp:  Normal effort.  CTAB. Abd:  Nontender.  No distention.  Other:  Posterior oropharynx erythematous without tonsillar exudates or peritonsillar abscess.  Slightly raspy voice.  There is no muffled voice or drooling.  Shotty anterior cervical lymphadenopathy.  Supple neck without meningismus.   ED Results / Procedures / Treatments  Labs (all labs ordered are listed, but only abnormal results are displayed) Labs Reviewed  GROUP A STREP BY PCR  SARS CORONAVIRUS 2 BY RT PCR  CULTURE, BLOOD (ROUTINE X 2)  CULTURE, BLOOD (ROUTINE X 2)  LACTIC ACID, PLASMA  LACTIC ACID, PLASMA  CBC WITH DIFFERENTIAL/PLATELET  COMPREHENSIVE METABOLIC PANEL  URINALYSIS, ROUTINE W REFLEX MICROSCOPIC  MONONUCLEOSIS SCREEN  POC URINE PREG, ED     EKG  None   RADIOLOGY I have independently visualized and interpreted patient's x-ray as well as noted the radiology interpretation:  Chest x-ray: No acute cardiopulmonary process  Official radiology report(s): DG Chest 2 View  Result Date: 03/21/2023 CLINICAL DATA:  Cough and fever EXAM: CHEST - 2 VIEW COMPARISON:  07/13/2015 FINDINGS: The heart size and mediastinal contours are within normal limits. Both lungs are clear. The visualized skeletal structures are unremarkable. IMPRESSION: No active cardiopulmonary disease. Electronically Signed   By: Alcide Clever M.D.   On: 03/21/2023 23:08     PROCEDURES:  Critical Care performed: No  .1-3 Lead EKG  Interpretation  Performed by: Irean Hong, MD Authorized by: Irean Hong, MD     Interpretation: normal     ECG rate:  90   ECG rate assessment: normal     Rhythm: sinus rhythm     Ectopy: none     Conduction: normal   Comments:     Patient placed on cardiac monitor to evaluate for arrhythmias    MEDICATIONS ORDERED IN ED: Medications  sodium chloride 0.9 % bolus 1,000 mL (has no administration in time range)  ondansetron (ZOFRAN) injection 4 mg (has no administration in time range)  ketorolac (TORADOL) 30 MG/ML injection 15 mg (has no administration in time range)  magic mouthwash (has no administration in time range)  dexamethasone (DECADRON) injection 10 mg (has no administration in time range)  acetaminophen (TYLENOL) tablet 1,000 mg (1,000 mg Oral Given 03/21/23 2125)     IMPRESSION / MDM / ASSESSMENT AND PLAN / ED COURSE  I reviewed the triage vital signs and the nursing notes.                             27 year old female presenting with fever, sore throat, cough.  Differential diagnosis includes but is not limited to viral process such as COVID-19, influenza, community-acquired pneumonia, mononucleosis, etc.  I personally reviewed patient's records and note her urgent care visit from yesterday.  Patient's presentation is most consistent with acute illness / injury with system symptoms.  The patient is on the cardiac monitor to evaluate for evidence of arrhythmia and/or significant heart rate changes.  COVID, strep swabs and chest x-ray unremarkable.  Will obtain basic lab work including blood cultures and lactic acid, initiate IV fluid hydration, IV Toradol for body aches, IV Zofran for nausea, Decadron and Magic mouthwash for sore throat.  Will reassess.  Clinical Course as of 03/23/23 0258  Thu Mar 22, 2023  0626 Patient feeling better.  Updated patient and family member of all test results.  Given trace leukocytes and erythematous throat.  Will place her on  amoxicillin.  Strict return precautions given.  Patient verbalizes understanding and agrees with plan of care. [JS]    Clinical Course User Index [JS] Irean Hong, MD     FINAL CLINICAL IMPRESSION(S) / ED DIAGNOSES   Final diagnoses:  Fever, unspecified fever cause  Sore throat     Rx / DC Orders   ED Discharge Orders     None  Note:  This document was prepared using Dragon voice recognition software and may include unintentional dictation errors.   Irean Hong, MD 03/23/23 [phone]

## 2023-03-24 LAB — CULTURE, BLOOD (ROUTINE X 2)

## 2023-03-25 LAB — CULTURE, BLOOD (ROUTINE X 2)

## 2023-03-27 LAB — CULTURE, BLOOD (ROUTINE X 2)
Culture: NO GROWTH
Culture: NO GROWTH
Special Requests: ADEQUATE

## 2023-04-24 ENCOUNTER — Ambulatory Visit
Admission: RE | Admit: 2023-04-24 | Discharge: 2023-04-24 | Disposition: A | Discharge status: Home / Self Care | Payer: BC Managed Care – PPO | Source: Ambulatory Visit | Attending: Emergency Medicine | Admitting: Emergency Medicine

## 2023-04-24 VITALS — BP 102/70 | HR 108 | Temp 98.4°F | Resp 18

## 2023-04-24 DIAGNOSIS — R300 Dysuria: Secondary | ICD-10-CM | POA: Insufficient documentation

## 2023-04-24 DIAGNOSIS — N898 Other specified noninflammatory disorders of vagina: Secondary | ICD-10-CM | POA: Insufficient documentation

## 2023-04-24 LAB — POCT URINALYSIS DIP (MANUAL ENTRY)
Bilirubin, UA: NEGATIVE
Glucose, UA: NEGATIVE mg/dL
Ketones, POC UA: NEGATIVE mg/dL
Nitrite, UA: NEGATIVE
Protein Ur, POC: NEGATIVE mg/dL
Spec Grav, UA: 1.02 (ref 1.010–1.025)
Urobilinogen, UA: 0.2 E.U./dL
pH, UA: 6 (ref 5.0–8.0)

## 2023-04-24 LAB — POCT URINE PREGNANCY: Preg Test, Ur: NEGATIVE

## 2023-04-24 MED ORDER — FLUCONAZOLE 150 MG PO TABS: 150.0000 mg | ORAL_TABLET | Freq: Every day | ORAL | 0 refills | Status: AC

## 2023-04-24 MED ORDER — CEPHALEXIN 500 MG PO CAPS: 500.0000 mg | ORAL_CAPSULE | Freq: Two times a day (BID) | ORAL | 0 refills | Status: AC

## 2023-04-24 NOTE — ED Triage Notes (Signed)
Patient to Urgent Care with complaints of vaginal discharge/ dysuria/ urinary frequency that started 2 days ago. Vaginal discharge x3 days.   Denies any known fevers.

## 2023-04-24 NOTE — Discharge Instructions (Addendum)
Take the antibiotic as directed.  The urine culture is pending.  We will call you if it shows the need to change or discontinue your antibiotic.    Take the Diflucan as directed.    Your vaginal tests are pending.  If your test results are positive, we will call you.  You and your sexual partner(s) may require treatment at that time.  Do not have sexual activity for at least 7 days.    Follow up with your primary care provider if your symptoms are not improving.     

## 2023-04-24 NOTE — ED Provider Notes (Signed)
[name]    CSN: [id number] Arrival date & time: 04/24/23  1247      History   Chief Complaint Chief Complaint  Patient presents with   Urinary Frequency    Pain when urinating as well - Entered by patient    HPI Kendra Murphy is a 27 y.o. female.  Patient presents with 2-3 day history of dysuria, urinary frequency, vaginal discharge, vaginal itching.  No fever, hematuria, abdominal pain, flank pain, pelvic pain, rash, or other symptoms.  No treatments at home.  The history is provided by the patient and medical records.    Past Medical History:  Diagnosis Date   Anxiety    Chronic constipation     There are no problems to display for this patient.   Past Surgical History:  Procedure Laterality Date   WISDOM TOOTH EXTRACTION      OB History   No obstetric history on file.      Home Medications    Prior to Admission medications   Medication Sig Start Date End Date Taking? Authorizing Provider  cephALEXin (KEFLEX) 500 MG capsule Take 1 capsule (500 mg total) by mouth 2 (two) times daily for 5 days. 04/24/23 04/29/23 Yes Mickie Bail, NP  fluconazole (DIFLUCAN) 150 MG tablet Take 1 tablet (150 mg total) by mouth daily. Take one tablet today.  May repeat in 3 days. 04/24/23  Yes Mickie Bail, NP  Docusate Sodium (DSS) 100 MG CAPS Take by mouth.    [provider]  Normajean Baxter 0.25-35 MG-MCG tablet Take by mouth.    [provider]  levonorgestrel (LILETTA) 19.5 MCG/DAY IUD IUD by Intrauterine route. 08/11/19 08/10/25  [provider]  loperamide (IMODIUM) 2 MG capsule Take 1 capsule (2 mg total) by mouth 4 (four) times daily as needed for diarrhea or loose stools. 08/16/21   Valinda Hoar, NP  magic mouthwash SOLN 12mL Anbesol 30mL Benadryl 30mL Mylanta  5mL swish, gargle & spit q8hr prn throat discomfort 03/22/23   Irean Hong, MD  ondansetron (ZOFRAN) 4 MG tablet Take 1 tablet (4 mg total) by mouth every 6 (six) hours.  08/16/21   Valinda Hoar, NP  sertraline (ZOLOFT) 25 MG tablet Take 25 mg by mouth daily. Patient not taking: Reported on 04/24/2023    [provider]    Family History Family History  Problem Relation Age of Onset   Healthy Mother    Healthy Father     Social History Social History   Tobacco Use   Smoking status: Never   Smokeless tobacco: Never  Vaping Use   Vaping Use: Never used  Substance Use Topics   Alcohol use: Yes    Comment: social   Drug use: Not Currently     Allergies   Tioconazole   Review of Systems Review of Systems  Constitutional:  Negative for chills and fever.  Gastrointestinal:  Negative for abdominal pain, nausea and vomiting.  Genitourinary:  Positive for dysuria and vaginal discharge. Negative for flank pain, frequency, hematuria and pelvic pain.  Skin:  Negative for color change and rash.     Physical Exam Triage Vital Signs ED Triage Vitals  Enc Vitals Group     BP 04/24/23 1306 102/70     Pulse Rate 04/24/23 1301 (!) 108     Resp 04/24/23 1301 18     Temp 04/24/23 1301 98.4 F (36.9 C)     Temp src --  SpO2 04/24/23 1301 98 %     Weight --      Height --      Head Circumference --      Peak Flow --      Pain Score 04/24/23 1304 0     Pain Loc --      Pain Edu? --      Excl. in GC? --    No data found.  Updated Vital Signs BP 102/70   Pulse (!) 108   Temp 98.4 F (36.9 C)   Resp 18   SpO2 98%   Visual Acuity Right Eye Distance:   Left Eye Distance:   Bilateral Distance:    Right Eye Near:   Left Eye Near:    Bilateral Near:     Physical Exam Vitals and nursing note reviewed.  Constitutional:      General: She is not in acute distress.    Appearance: She is well-developed.  HENT:     Mouth/Throat:     Mouth: Mucous membranes are moist.  Cardiovascular:     Rate and Rhythm: Normal rate and regular rhythm.     Heart sounds: Normal heart sounds.  Pulmonary:     Effort: Pulmonary effort is  normal. No respiratory distress.     Breath sounds: Normal breath sounds.  Abdominal:     General: Bowel sounds are normal.     Palpations: Abdomen is soft.     Tenderness: There is no abdominal tenderness. There is no right CVA tenderness, left CVA tenderness, guarding or rebound.  Genitourinary:    Comments: Patient declines GU exam. Musculoskeletal:     Cervical back: Neck supple.  Skin:    General: Skin is warm and dry.  Neurological:     Mental Status: She is alert.  Psychiatric:        Mood and Affect: Mood normal.        Behavior: Behavior normal.      UC Treatments / Results  Labs (all labs ordered are listed, but only abnormal results are displayed) Labs Reviewed  POCT URINALYSIS DIP (MANUAL ENTRY) - Abnormal; Notable for the following components:      Result Value   Blood, UA moderate (*)    Leukocytes, UA Small (1+) (*)    All other components within normal limits  URINE CULTURE  POCT URINE PREGNANCY  CERVICOVAGINAL ANCILLARY ONLY    EKG   Radiology No results found.  Procedures Procedures (including critical care time)  Medications Ordered in UC Medications - No data to display  Initial Impression / Assessment and Plan / UC Course  I have reviewed the triage vital signs and the nursing notes.  Pertinent labs & imaging results that were available during my care of the patient were reviewed by me and considered in my medical decision making (see chart for details).    Dysuria, vaginal discharge, vaginal itching, negative pregnancy test.  Treating with Keflex and Diflucan. Urine culture pending. Patient obtained vaginal self swab for testing.  Instructed patient to abstain from sexual activity for at least 7 days.  Instructed her to follow-up with her PCP or gynecologist if her symptoms are not improving.  Patient agrees to plan of care.   Final Clinical Impressions(s) / UC Diagnoses   Final diagnoses:  Dysuria  Vaginal discharge  Vaginal itching      Discharge Instructions      Take the antibiotic as directed.  The urine culture is pending.  We will  call you if it shows the need to change or discontinue your antibiotic.    Take the Diflucan as directed.    Your vaginal tests are pending.  If your test results are positive, we will call you.  You and your sexual partner(s) may require treatment at that time.  Do not have sexual activity for at least 7 days.    Follow up with your primary care provider if your symptoms are not improving.        ED Prescriptions     Medication Sig Dispense Auth. Provider   cephALEXin (KEFLEX) 500 MG capsule Take 1 capsule (500 mg total) by mouth 2 (two) times daily for 5 days. 10 capsule Mickie Bail, NP   fluconazole (DIFLUCAN) 150 MG tablet Take 1 tablet (150 mg total) by mouth daily. Take one tablet today.  May repeat in 3 days. 2 tablet Mickie Bail, NP      PDMP not reviewed this encounter.   Mickie Bail, NP 04/24/23 1323

## 2023-04-25 LAB — URINE CULTURE: Culture: <10000 — AB

## 2023-04-25 LAB — CERVICOVAGINAL ANCILLARY ONLY
Bacterial Vaginitis (gardnerella): POSITIVE — AB
Candida Glabrata: NEGATIVE
Candida Vaginitis: NEGATIVE
Chlamydia: NEGATIVE
Comment: NEGATIVE
Comment: NEGATIVE
Comment: NEGATIVE
Comment: NEGATIVE
Comment: NEGATIVE
Comment: NORMAL
Neisseria Gonorrhea: NEGATIVE
Trichomonas: NEGATIVE

## 2023-04-26 ENCOUNTER — Telehealth (HOSPITAL_COMMUNITY): Payer: Self-pay | Admitting: Emergency Medicine

## 2023-04-26 MED ORDER — METRONIDAZOLE 500 MG PO TABS: 500.0000 mg | ORAL_TABLET | Freq: Two times a day (BID) | ORAL | 0 refills | Status: AC

## 2023-07-20 ENCOUNTER — Ambulatory Visit
Admission: RE | Admit: 2023-07-20 | Discharge: 2023-07-20 | Disposition: A | Discharge status: Home / Self Care | Payer: BC Managed Care – PPO | Source: Ambulatory Visit | Attending: Emergency Medicine | Admitting: Emergency Medicine

## 2023-07-20 VITALS — BP 110/70 | HR 78 | Temp 97.3°F | Resp 18

## 2023-07-20 DIAGNOSIS — R300 Dysuria: Secondary | ICD-10-CM | POA: Diagnosis not present

## 2023-07-20 DIAGNOSIS — Z3202 Encounter for pregnancy test, result negative: Secondary | ICD-10-CM | POA: Diagnosis not present

## 2023-07-20 DIAGNOSIS — R350 Frequency of micturition: Secondary | ICD-10-CM | POA: Diagnosis not present

## 2023-07-20 DIAGNOSIS — N3001 Acute cystitis with hematuria: Secondary | ICD-10-CM | POA: Diagnosis present

## 2023-07-20 DIAGNOSIS — R109 Unspecified abdominal pain: Secondary | ICD-10-CM | POA: Diagnosis not present

## 2023-07-20 DIAGNOSIS — Z113 Encounter for screening for infections with a predominantly sexual mode of transmission: Secondary | ICD-10-CM | POA: Diagnosis present

## 2023-07-20 LAB — POCT URINALYSIS DIP (MANUAL ENTRY)
Bilirubin, UA: NEGATIVE
Glucose, UA: 100 mg/dL — AB
Ketones, POC UA: NEGATIVE mg/dL
Nitrite, UA: NEGATIVE
Protein Ur, POC: >=300 mg/dL — AB
Spec Grav, UA: 1.025 (ref 1.010–1.025)
Urobilinogen, UA: 1 U/dL
pH, UA: 7.5 (ref 5.0–8.0)

## 2023-07-20 LAB — POCT URINE PREGNANCY: Preg Test, Ur: NEGATIVE

## 2023-07-20 MED ORDER — PHENAZOPYRIDINE HCL 200 MG PO TABS: 200.0000 mg | ORAL_TABLET | Freq: Three times a day (TID) | ORAL | 0 refills | Status: AC

## 2023-07-20 MED ORDER — CEPHALEXIN 500 MG PO CAPS: 500.0000 mg | ORAL_CAPSULE | Freq: Two times a day (BID) | ORAL | 0 refills | Status: AC

## 2023-07-20 NOTE — ED Provider Notes (Signed)
Kendra Murphy    CSN: [id number] Arrival date & time: 07/20/23  4098      History   Chief Complaint Chief Complaint  Patient presents with   Urinary Frequency    Urinary frequency and a lot of pain with urination as well - Entered by patient    HPI Kendra Murphy is a 27 y.o. female.  Patient presents with 2-day history of dysuria, urinary frequency, flank pain, bladder pressure.  No fever, chills, abdominal pain, vaginal discharge, pelvic pain, or other symptoms.  Patient also requests STD and bacterial vaginitis testing.  Her medical history includes bacterial vaginitis.  Patient also reports she had a telehealth visit and was treated Macrobid for a UTI 2 weeks ago.  The history is provided by the patient and medical records.    Past Medical History:  Diagnosis Date   Anxiety    Chronic constipation     There are no problems to display for this patient.   Past Surgical History:  Procedure Laterality Date   WISDOM TOOTH EXTRACTION      OB History   No obstetric history on file.      Home Medications    Prior to Admission medications   Medication Sig Start Date End Date Taking? Authorizing Provider  cephALEXin (KEFLEX) 500 MG capsule Take 1 capsule (500 mg total) by mouth 2 (two) times daily for 5 days. 07/20/23 07/25/23 Yes Mickie Bail, NP  Estradiol 10 MCG TABS vaginal tablet 1 tablet (0.01 mg total) Two (2) times a week. 01/22/22  Yes [provider]  phenazopyridine (PYRIDIUM) 200 MG tablet Take 1 tablet (200 mg total) by mouth 3 (three) times daily. 07/20/23  Yes Mickie Bail, NP  Docusate Sodium (DSS) 100 MG CAPS Take by mouth. Patient not taking: Reported on 07/20/2023    [provider]  ESTARYLLA 0.25-35 MG-MCG tablet Take by mouth.    [provider]  fluconazole (DIFLUCAN) 150 MG tablet Take 1 tablet (150 mg total) by mouth daily. Take one tablet today.  May repeat in 3 days. Patient not taking: Reported on 07/20/2023  04/24/23   Mickie Bail, NP  levonorgestrel (LILETTA) 19.5 MCG/DAY IUD IUD by Intrauterine route. 08/11/19 08/10/25  [provider]  loperamide (IMODIUM) 2 MG capsule Take 1 capsule (2 mg total) by mouth 4 (four) times daily as needed for diarrhea or loose stools. Patient not taking: Reported on 07/20/2023 08/16/21   Valinda Hoar, NP  magic mouthwash SOLN 12mL Anbesol 30mL Benadryl 30mL Mylanta  5mL swish, gargle & spit q8hr prn throat discomfort 03/22/23   Irean Hong, MD  metroNIDAZOLE (FLAGYL) 500 MG tablet Take 1 tablet (500 mg total) by mouth 2 (two) times daily. Patient not taking: Reported on 07/20/2023 04/26/23   Merrilee Jansky, MD  ondansetron (ZOFRAN) 4 MG tablet Take 1 tablet (4 mg total) by mouth every 6 (six) hours. 08/16/21   Valinda Hoar, NP  sertraline (ZOLOFT) 25 MG tablet Take 25 mg by mouth daily. Patient not taking: Reported on 04/24/2023    [provider]    Family History Family History  Problem Relation Age of Onset   Healthy Mother    Healthy Father     Social History Social History   Tobacco Use   Smoking status: Never   Smokeless tobacco: Never  Vaping Use   Vaping status: Never Used  Substance Use Topics   Alcohol use: Yes    Comment: social  Drug use: Not Currently     Allergies   Tioconazole   Review of Systems Review of Systems  Constitutional:  Negative for chills and fever.  Gastrointestinal:  Negative for abdominal pain, diarrhea and vomiting.  Genitourinary:  Positive for dysuria, flank pain and frequency. Negative for hematuria, pelvic pain and vaginal discharge.     Physical Exam Triage Vital Signs ED Triage Vitals [07/20/23 0949]  Encounter Vitals Group     BP      Systolic BP Percentile      Diastolic BP Percentile      Pulse      Resp      Temp      Temp src      SpO2      Weight      Height      Head Circumference      Peak Flow      Pain Score 2     Pain Loc      Pain Education       Exclude from Growth Chart    No data found.  Updated Vital Signs BP 110/70   Pulse 78   Temp (!) 97.3 F (36.3 C)   Resp 18   SpO2 98%   Visual Acuity Right Eye Distance:   Left Eye Distance:   Bilateral Distance:    Right Eye Near:   Left Eye Near:    Bilateral Near:     Physical Exam Constitutional:      General: She is not in acute distress. HENT:     Mouth/Throat:     Mouth: Mucous membranes are moist.  Cardiovascular:     Rate and Rhythm: Normal rate and regular rhythm.     Heart sounds: Normal heart sounds.  Pulmonary:     Effort: Pulmonary effort is normal. No respiratory distress.     Breath sounds: Normal breath sounds.  Abdominal:     General: Bowel sounds are normal.     Palpations: Abdomen is soft.     Tenderness: There is no abdominal tenderness. There is no right CVA tenderness, left CVA tenderness, guarding or rebound.  Skin:    General: Skin is warm and dry.  Neurological:     Mental Status: She is alert.  Psychiatric:        Mood and Affect: Mood normal.        Behavior: Behavior normal.      UC Treatments / Results  Labs (all labs ordered are listed, but only abnormal results are displayed) Labs Reviewed  POCT URINALYSIS DIP (MANUAL ENTRY) - Abnormal; Notable for the following components:      Result Value   Clarity, UA cloudy (*)    Glucose, UA =100 (*)    Blood, UA large (*)    Protein Ur, POC >=300 (*)    Leukocytes, UA Small (1+) (*)    All other components within normal limits  POCT URINE PREGNANCY  CERVICOVAGINAL ANCILLARY ONLY    EKG   Radiology No results found.  Procedures Procedures (including critical care time)  Medications Ordered in UC Medications - No data to display  Initial Impression / Assessment and Plan / UC Course  I have reviewed the triage vital signs and the nursing notes.  Pertinent labs & imaging results that were available during my care of the patient were reviewed by me and considered in my  medical decision making (see chart for details).   STD screening, negative pregnancy test, acute  cystitis with hematuria.  Treating with Keflex.  Per patient request, also treating with Pyridium.  Urine culture pending. Discussed with patient that we will call her if the urine culture shows the need to change or discontinue the antibiotic.  Patient also request STD and BV testing.  She obtained vaginal self swab for testing.  Discussed that we will call if test results are positive.  Discussed that she may require treatment at that time.  Discussed that sexual partner(s) may also require treatment.  Instructed patient to abstain from sexual activity for at least 7 days.  Instructed her to follow-up with her PCP if her symptoms are not improving.  Patient agrees to plan of care.       Final Clinical Impressions(s) / UC Diagnoses   Final diagnoses:  Screening for STD (sexually transmitted disease)  Negative pregnancy test  Acute cystitis with hematuria     Discharge Instructions      Take the antibiotic as directed.  The urine culture is pending.  We will call you if it shows the need to change or discontinue your antibiotic.    Your vaginal tests are pending.  If your test results are positive, we will call you.  You and your sexual partner(s) may require treatment at that time.  Do not have sexual activity for at least 7 days.    Follow-up with your primary care provider if your symptoms are not improving.        ED Prescriptions     Medication Sig Dispense Auth. Provider   cephALEXin (KEFLEX) 500 MG capsule Take 1 capsule (500 mg total) by mouth 2 (two) times daily for 5 days. 10 capsule Mickie Bail, NP   phenazopyridine (PYRIDIUM) 200 MG tablet Take 1 tablet (200 mg total) by mouth 3 (three) times daily. 6 tablet Mickie Bail, NP      PDMP not reviewed this encounter.   Mickie Bail, NP 07/20/23 1018

## 2023-07-20 NOTE — Discharge Instructions (Addendum)
Take the antibiotic as directed.  The urine culture is pending.  We will call you if it shows the need to change or discontinue your antibiotic.    Your vaginal tests are pending.  If your test results are positive, we will call you.  You and your sexual partner(s) may require treatment at that time.  Do not have sexual activity for at least 7 days.    Follow up with your primary care provider if your symptoms are not improving.      

## 2023-07-20 NOTE — ED Triage Notes (Signed)
Patient to Urgent Care with complaints of urinary frequency/ dysuria/ bilateral flank pain/ suprapubic pressure.  Reports symptoms started two days ago.  Reports taking an abx for a UTI prescribed via a telehealth visit two weeks ago. Symptoms much worse this time.

## 2023-07-23 LAB — CERVICOVAGINAL ANCILLARY ONLY
Bacterial Vaginitis (gardnerella): NEGATIVE
Candida Glabrata: NEGATIVE
Candida Vaginitis: NEGATIVE
Chlamydia: NEGATIVE
Comment: NEGATIVE
Comment: NEGATIVE
Comment: NEGATIVE
Comment: NEGATIVE
Comment: NEGATIVE
Comment: NORMAL
Neisseria Gonorrhea: NEGATIVE
Trichomonas: NEGATIVE
